# Patient Record
Sex: Male | Born: 1985 | Race: White | Hispanic: No | Marital: Single | Smoking: Never smoker
Health system: Southern US, Community
[De-identification: ages and names within clinical notes are randomized; demographics above are authoritative.]

## PROBLEM LIST (undated history)

## (undated) DIAGNOSIS — K5090 Crohn's disease, unspecified, without complications: Secondary | ICD-10-CM

## (undated) DIAGNOSIS — F32A Depression, unspecified: Secondary | ICD-10-CM

## (undated) DIAGNOSIS — G809 Cerebral palsy, unspecified: Secondary | ICD-10-CM

## (undated) DIAGNOSIS — G8389 Other specified paralytic syndromes: Secondary | ICD-10-CM

## (undated) DIAGNOSIS — F329 Major depressive disorder, single episode, unspecified: Secondary | ICD-10-CM

## (undated) HISTORY — PX: KNEE SURGERY: SHX244

## (undated) HISTORY — PX: RHIZOTOMY: SHX2355

## (undated) HISTORY — PX: HIP OSTEOTOMY: SHX984

---
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
## 2019-05-30 ENCOUNTER — Emergency Department (HOSPITAL_BASED_OUTPATIENT_CLINIC_OR_DEPARTMENT_OTHER): Payer: Medicare Other

## 2019-05-30 ENCOUNTER — Other Ambulatory Visit: Payer: Self-pay

## 2019-05-30 ENCOUNTER — Emergency Department (HOSPITAL_BASED_OUTPATIENT_CLINIC_OR_DEPARTMENT_OTHER)
Admission: EM | Admit: 2019-05-30 | Discharge: 2019-05-30 | Disposition: A | Discharge status: Home / Self Care | Payer: Medicare Other | Attending: Emergency Medicine | Admitting: Emergency Medicine

## 2019-05-30 ENCOUNTER — Encounter (HOSPITAL_BASED_OUTPATIENT_CLINIC_OR_DEPARTMENT_OTHER): Payer: Self-pay

## 2019-05-30 DIAGNOSIS — G510 Bell's palsy: Secondary | ICD-10-CM | POA: Diagnosis not present

## 2019-05-30 DIAGNOSIS — Z79899 Other long term (current) drug therapy: Secondary | ICD-10-CM | POA: Insufficient documentation

## 2019-05-30 DIAGNOSIS — R519 Headache, unspecified: Secondary | ICD-10-CM | POA: Insufficient documentation

## 2019-05-30 DIAGNOSIS — R29810 Facial weakness: Secondary | ICD-10-CM | POA: Diagnosis present

## 2019-05-30 DIAGNOSIS — G8389 Other specified paralytic syndromes: Secondary | ICD-10-CM | POA: Diagnosis not present

## 2019-05-30 HISTORY — DX: Other specified paralytic syndromes: G83.89

## 2019-05-30 HISTORY — DX: Depression, unspecified: F32.A

## 2019-05-30 HISTORY — DX: Crohn's disease, unspecified, without complications: K50.90

## 2019-05-30 LAB — PROTIME-INR
INR: 1 (ref 0.8–1.2)
Prothrombin Time: 13 seconds (ref 11.4–15.2)

## 2019-05-30 LAB — DIFFERENTIAL
Abs Immature Granulocytes: 0.03 10*3/uL (ref 0.00–0.07)
Basophils Absolute: 0.1 10*3/uL (ref 0.0–0.1)
Basophils Relative: 1 %
Eosinophils Absolute: 0.1 10*3/uL (ref 0.0–0.5)
Eosinophils Relative: 2 %
Immature Granulocytes: 0 %
Lymphocytes Relative: 16 %
Lymphs Abs: 1.4 10*3/uL (ref 0.7–4.0)
Monocytes Absolute: 0.6 10*3/uL (ref 0.1–1.0)
Monocytes Relative: 7 %
Neutro Abs: 6.6 10*3/uL (ref 1.7–7.7)
Neutrophils Relative %: 74 %

## 2019-05-30 LAB — CBC
HCT: 45.6 % (ref 39.0–52.0)
Hemoglobin: 14.8 g/dL (ref 13.0–17.0)
MCH: 27.3 pg (ref 26.0–34.0)
MCHC: 32.5 g/dL (ref 30.0–36.0)
MCV: 84 fL (ref 80.0–100.0)
Platelets: 286 10*3/uL (ref 150–400)
RBC: 5.43 MIL/uL (ref 4.22–5.81)
RDW: 13 % (ref 11.5–15.5)
WBC: 8.9 10*3/uL (ref 4.0–10.5)
nRBC: 0 % (ref 0.0–0.2)

## 2019-05-30 LAB — RAPID URINE DRUG SCREEN, HOSP PERFORMED
Amphetamines: NOT DETECTED
Barbiturates: NOT DETECTED
Benzodiazepines: NOT DETECTED
Cocaine: NOT DETECTED
Opiates: NOT DETECTED
Tetrahydrocannabinol: NOT DETECTED

## 2019-05-30 LAB — COMPREHENSIVE METABOLIC PANEL
ALT: 27 U/L (ref 0–44)
AST: 18 U/L (ref 15–41)
Albumin: 4.2 g/dL (ref 3.5–5.0)
Alkaline Phosphatase: 34 U/L — ABNORMAL LOW (ref 38–126)
Anion gap: 9 (ref 5–15)
BUN: 10 mg/dL (ref 6–20)
CO2: 27 mmol/L (ref 22–32)
Calcium: 9 mg/dL (ref 8.9–10.3)
Chloride: 102 mmol/L (ref 98–111)
Creatinine, Ser: 0.68 mg/dL (ref 0.61–1.24)
GFR calc Af Amer: >60 mL/min (ref >60)
GFR calc non Af Amer: >60 mL/min (ref >60)
Glucose, Bld: 126 mg/dL — ABNORMAL HIGH (ref 70–99)
Potassium: 4.1 mmol/L (ref 3.5–5.1)
Sodium: 138 mmol/L (ref 135–145)
Total Bilirubin: 0.4 mg/dL (ref 0.3–1.2)
Total Protein: 8.2 g/dL — ABNORMAL HIGH (ref 6.5–8.1)

## 2019-05-30 LAB — URINALYSIS, ROUTINE W REFLEX MICROSCOPIC
Bilirubin Urine: NEGATIVE
Glucose, UA: NEGATIVE mg/dL
Hgb urine dipstick: NEGATIVE
Ketones, ur: NEGATIVE mg/dL
Leukocytes,Ua: NEGATIVE
Nitrite: NEGATIVE
Protein, ur: NEGATIVE mg/dL
Specific Gravity, Urine: 1.025 (ref 1.005–1.030)
pH: 7 (ref 5.0–8.0)

## 2019-05-30 LAB — ETHANOL: Alcohol, Ethyl (B): <10 mg/dL (ref <10)

## 2019-05-30 LAB — APTT: aPTT: 32 seconds (ref 24–36)

## 2019-05-30 MED ORDER — VALACYCLOVIR HCL 1 G PO TABS: 1000.0000 mg | ORAL_TABLET | Freq: Three times a day (TID) | ORAL | 0 refills | Status: AC

## 2019-05-30 MED ORDER — DIAZEPAM 5 MG/ML IJ SOLN
5.0000 mg | Freq: Once | INTRAMUSCULAR | Status: AC
  Administered 2019-05-30: 5 mg via INTRAVENOUS
  Filled 2019-05-30: qty 2

## 2019-05-30 MED ORDER — PREDNISONE 20 MG PO TABS: 60.0000 mg | ORAL_TABLET | Freq: Every day | ORAL | 0 refills | Status: AC

## 2019-05-30 MED ORDER — ARTIFICIAL TEARS OPHTHALMIC OINT: TOPICAL_OINTMENT | Freq: Every day | OPHTHALMIC | 0 refills | Status: AC

## 2019-05-30 MED ORDER — HYPROMELLOSE (GONIOSCOPIC) 2.5 % OP SOLN: 1.0000 [drp] | Freq: Four times a day (QID) | OPHTHALMIC | 0 refills | Status: AC

## 2019-05-30 NOTE — Discharge Instructions (Addendum)
Take Valtrex and prednisone as prescribed for 1 week.  Use 1 drop in the right eye artificial tears 4 times daily during the day and apply artificial tears ophthalmic ointment at night.  This is to help keep your eye moist due to the Bell's palsy causing you to have difficulty closing your eyes completely.  Please follow-up with your doctor next week for recheck.  Please return to emergency department if you develop any new or worsening symptoms.

## 2019-05-30 NOTE — ED Triage Notes (Signed)
Pt presents to ED with mother, states that when he woke up this morning around 12 pm his face was "drawn up to the left". Mother at bedside also reports that he was c/o headache last night. Pt also reports some chest discomfort starting yesterday.

## 2019-05-30 NOTE — Consult Note (Signed)
[facility] Consult Services  Stat Consult  Date of Service:   05/30/2019 18:03:03  Impression:     .  Bell's palsy  Comments/Sign-Out: Recommended MRI brain because he had COVID 19 and want to make sure no lesions causing the right facial palsy. If MRI brain is normal then he can be discharged with prednisone and acyclovir. Recommend follow up with PCP in 1 week after discharged. Oriented mother to make sure that his eye is protected at all times. Needs lubricating drops while awake and also sunglasses or PPE when going out. While sleeping should have a gauze with tape to make sure the eye stay closed while sleeping.  CT HEAD: Showed No Acute Hemorrhage or Acute Core Infarct  Metrics: [facility] Notification Time: 05/30/2019 18:00:42 Stamp Time: 05/30/2019 18:03:03 Callback Response Time: 05/30/2019 18:03:39 Video Start Time: 05/30/2019 18:14:00 Video End Time: 05/30/2019 18:17:05  Our recommendations are outlined below.  Imaging Studies:     .  MRI Head  Disposition: Neurology Follow Up Recommended  Sign Out:     .  Discussed with Emergency Department Provider  ----------------------------------------------------------------------------------------------------  Chief Complaint: right facial paresis  History of Present Illness: Patient is a 33 year old Male.  Patient is 33 y/o male that reported woke up with right facial paresis today. Patient also reported pain on right side of face and jaw and left upper arm. Denied weakness and numbness. He does have history of cerebral palsy but he is able to give some history and his mother is at bedside helping him with history. He had Covid 19 between July and August this year.    Past Medical History:     . There is NO history of Hypertension     . There is NO history of Diabetes Mellitus     . There is NO history of Hyperlipidemia     . There is NO history of Atrial Fibrillation     .  There is NO history of Coronary Artery Disease     . There is NO history of Stroke  Anticoagulant use:  No  Antiplatelet use: No Examination: BP(152/100), Pulse(107), Blood Glucose(126) 1A: Level of Consciousness - Alert; keenly responsive + 0 1B: Ask Month and Age - Both Questions Right + 0 1C: Blink Eyes & Squeeze Hands - Performs Both Tasks + 0 2: Test Horizontal Extraocular Movements - Normal + 0 3: Test Visual Fields - No Visual Loss + 0 4: Test Facial Palsy (Use Grimace if Obtunded) - Unilateral Complete paralysis (upper/lower face) + 3 5A: Test Left Arm Motor Drift - No Drift for 10 Seconds + 0 5B: Test Right Arm Motor Drift - No Drift for 10 Seconds + 0 6A: Test Left Leg Motor Drift - No Drift for 5 Seconds + 0 6B: Test Right Leg Motor Drift - No Drift for 5 Seconds + 0 7: Test Limb Ataxia (FNF/Heel-Shin) - No Ataxia + 0 8: Test Sensation - Normal; No sensory loss + 0 9: Test Language/Aphasia - Normal; No aphasia + 0 10: Test Dysarthria - Normal + 0 11: Test Extinction/Inattention - No abnormality + 0  NIHSS Score: 3     Dr Anabel Halon   [facility] [phone]  Case [id number]

## 2019-05-30 NOTE — ED Notes (Signed)
Tele Neuro set up in room.

## 2019-05-30 NOTE — ED Provider Notes (Signed)
[facility] EMERGENCY DEPARTMENT Provider Note   CSN: [id number] Arrival date & time: 05/30/19  1556     History   Chief Complaint Chief Complaint  Patient presents with  . Stroke Symptoms    HPI Arthur Abbott is a 33 y.o. male with history of cerebral palsy with triplegia, Crohn's disease who presents with right-sided facial droop and headache.  Last known normal last evening around 8 PM.  Patient denies any new numbness or weakness, however triplegic at baseline.  No new speech changes noted by family.  Patient denies any vision changes, although does not wear his glasses when he is supposed to, and notes his vision has been worsening.  He denies any chest pain, shortness of breath, abdominal pain, nausea, vomiting.     HPI  Past Medical History:  Diagnosis Date  . Crohn disease ([place])   . Depression   . 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   History reviewed. No pertinent surgical history.      Home Medications    Prior to Admission medications   Medication Sig Start Date End Date Taking? Authorizing Provider  busPIRone (BUSPAR) 15 MG tablet Take 1 tab bid 01/17/17  Yes [provider]  DULoxetine (CYMBALTA) 60 MG capsule Take 1 cap twice a day 01/17/17  Yes [provider]  mercaptopurine (PURINETHOL) 50 MG tablet TAKE ONE BY MOUTH DAILY 03/28/14  Yes [provider]  risperiDONE (RISPERDAL) 0.25 MG tablet Take 1 to 2 tabs in the morning and 1 to 2 tab at night 01/17/17  Yes [provider]  artificial tears (LACRILUBE) OINT ophthalmic ointment Place into both eyes at bedtime. 05/30/19   Lashawna Poche, Bea Graff, PA-C  hydroxypropyl methylcellulose / hypromellose (ISOPTO TEARS / GONIOVISC) 2.5 % ophthalmic solution Place 1 drop into the right eye 4 (four) times daily. 05/30/19   Jovann Luse, Bea Graff, PA-C  predniSONE (DELTASONE) 20 MG tablet Take 3 tablets (60 mg total) by mouth daily for 7 days.  05/30/19 06/06/19  Frederica Kuster, PA-C  valACYclovir (VALTREX) 1000 MG tablet Take 1 tablet (1,000 mg total) by mouth 3 (three) times daily for 7 days. 05/30/19 06/06/19  Frederica Kuster, PA-C    Family History History reviewed. No pertinent family history.  Social History Social History   Tobacco Use  . Smoking status: Never Smoker  . Smokeless tobacco: Never Used  Substance Use Topics  . Alcohol use: Never    Frequency: Never  . Drug use: Never     Allergies   Patient has no known allergies.   Review of Systems Review of Systems  Constitutional: Negative for chills and fever.  HENT: Positive for facial swelling. Negative for sore throat.   Respiratory: Negative for shortness of breath.   Cardiovascular: Negative for chest pain.  Gastrointestinal: Negative for abdominal pain, nausea and vomiting.  Genitourinary: Negative for dysuria.  Musculoskeletal: Negative for back pain.  Skin: Negative for rash and wound.  Neurological: Positive for headaches.  Psychiatric/Behavioral: The patient is not nervous/anxious.      Physical Exam Updated Vital Signs BP (!) 143/93 (BP Location: Right Arm)   Pulse 94   Temp 98.6 F (37 C) (Oral)   Resp 20   Ht 5\' 8"  (1.727 m)   Wt 97.5 kg   SpO2 100%   BMI 32.69 kg/m   Physical Exam Vitals signs and nursing note reviewed.  Constitutional:  General: He is not in acute distress.    Appearance: He is well-developed. He is not diaphoretic.  HENT:     Head: Normocephalic and atraumatic.     Mouth/Throat:     Pharynx: No oropharyngeal exudate.  Eyes:     General: No scleral icterus.       Right eye: No discharge.        Left eye: No discharge.     Conjunctiva/sclera: Conjunctivae normal.     Pupils: Pupils are equal, round, and reactive to light.     Comments: Difficulty closing right eye  Neck:     Musculoskeletal: Normal range of motion and neck supple.     Thyroid: No thyromegaly.  Cardiovascular:     Rate and  Rhythm: Normal rate and regular rhythm.     Heart sounds: Normal heart sounds. No murmur. No friction rub. No gallop.   Pulmonary:     Effort: Pulmonary effort is normal. No respiratory distress.     Breath sounds: Normal breath sounds. No stridor. No wheezing or rales.  Abdominal:     General: Bowel sounds are normal. There is no distension.     Palpations: Abdomen is soft.     Tenderness: There is no abdominal tenderness. There is no guarding or rebound.  Lymphadenopathy:     Cervical: No cervical adenopathy.  Skin:    General: Skin is warm and dry.     Coloration: Skin is not pale.     Findings: No rash.  Neurological:     Mental Status: He is alert.     Comments: CN 3-12 intact; normal sensation throughout; 5/5 strength in bilateral upper extremities, 3/5 to lower extremities bilaterally; equal bilateral grip strength; right-sided facial droop; weakness in R eyelids       ED Treatments / Results  Labs (all labs ordered are listed, but only abnormal results are displayed) Labs Reviewed  COMPREHENSIVE METABOLIC PANEL - Abnormal; Notable for the following components:      Result Value   Glucose, Bld 126 (*)    Total Protein 8.2 (*)    Alkaline Phosphatase 34 (*)    All other components within normal limits  ETHANOL  PROTIME-INR  APTT  CBC  DIFFERENTIAL  RAPID URINE DRUG SCREEN, HOSP PERFORMED  URINALYSIS, ROUTINE W REFLEX MICROSCOPIC    EKG EKG Interpretation  Date/Time:  Saturday May 30 2019 16:47:31 EST Ventricular Rate:  113 PR Interval:    QRS Duration: 89 QT Interval:  320 QTC Calculation: 439 R Axis:   63 Text Interpretation: Sinus tachycardia Borderline T wave abnormalities No old tracing to compare Confirmed by Rolan Bucco [phone]) on 05/30/2019 8:15:40 PM   Radiology Ct Head Wo Contrast  Result Date: 05/30/2019 CLINICAL DATA:  33 year old male with history of headache. Right-sided facial droop. EXAM: CT HEAD WITHOUT CONTRAST TECHNIQUE:  Contiguous axial images were obtained from the base of the skull through the vertex without intravenous contrast. COMPARISON:  No priors. FINDINGS: Brain: No evidence of acute infarction, hemorrhage, hydrocephalus, extra-axial collection or mass lesion/mass effect. Vascular: No hyperdense vessel or unexpected calcification. Skull: Normal. Negative for fracture or focal lesion. Sinuses/Orbits: No acute finding. Other: None. IMPRESSION: 1. No acute intracranial abnormalities. The appearance of the brain is normal. Electronically Signed   By: Trudie Reed M.D.   On: 05/30/2019 17:28   Mr Brain Wo Contrast  Result Date: 05/30/2019 CLINICAL DATA:  Left-sided facial droop starting today. EXAM: MRI HEAD WITHOUT CONTRAST TECHNIQUE: Multiplanar, multiecho pulse  sequences of the brain and surrounding structures were obtained without intravenous contrast. COMPARISON:  None. FINDINGS: Brain: No acute infarction, hemorrhage, hydrocephalus, extra-axial collection or mass lesion. Paucity of periventricular white matter with corpus callosum thinning, periventricular T2 hyperintensity and small right sided corona radiata cyst. Findings consistent with PVL in this patient with history of cerebral palsy. Vascular: Normal flow voids. Skull and upper cervical spine: Normal marrow signal. Sinuses/Orbits: Negative. No abnormality noted at the temporal bone or in the visualized parotid region. IMPRESSION: 1. No acute finding. 2. Periventricular leukomalacia. Electronically Signed   By: Marnee SpringJonathon  Watts M.D.   On: 05/30/2019 20:25    Procedures Procedures (including critical care time)  Medications Ordered in ED Medications  diazepam (VALIUM) injection 5 mg (5 mg Intravenous Given 05/30/19 1915)     Initial Impression / Assessment and Plan / ED Course  I have reviewed the triage vital signs and the nursing notes.  Pertinent labs & imaging results that were available during my care of the patient were reviewed by me  and considered in my medical decision making (see chart for details).        Patient presenting with right-sided facial droop and possibly right-sided weakness.  Neuro exam is very difficult and clouded due to patient's triplegia.  Labs are unremarkable.  CT head is negative.  After CT head, patient was evaluated by teleneurologist, Dr. Louann LivFeliciano, who advised MRI of the brain.  This was negative for acute findings.  Suspect Bell's palsy as cause of patient's symptoms.  Will treat with prednisone and Valtrex.  We will also discharge home with artificial tears and ointment.  Follow-up to PCP for recheck.  Return precautions discussed.  Patient and mother understand and agree with plan.  Patient vitals stable throughout ED course and discharged in satisfactory condition.  Patient also evaluated by my attending, Dr. Fredderick PhenixBelfi, who guided the patient's management and agrees with plan.  Final Clinical Impressions(s) / ED Diagnoses   Final diagnoses:  Bell's palsy    ED Discharge Orders         Ordered    predniSONE (DELTASONE) 20 MG tablet  Daily     05/30/19 2122    valACYclovir (VALTREX) 1000 MG tablet  3 times daily     05/30/19 2122    artificial tears (LACRILUBE) OINT ophthalmic ointment  Daily at bedtime     05/30/19 2122    hydroxypropyl methylcellulose / hypromellose (ISOPTO TEARS / GONIOVISC) 2.5 % ophthalmic solution  4 times daily     05/30/19 2122           Emi HolesLaw, Janaiyah Blackard M, PA-C 05/30/19 2252    Rolan BuccoBelfi, Melanie, MD 05/30/19 2333

## 2019-05-30 NOTE — ED Notes (Signed)
Mother took patient to the car to allow husband to put him in the car.  Reports she will come back for paperwork.

## 2019-05-30 NOTE — ED Notes (Signed)
Taken to MRI 

## 2019-05-30 NOTE — ED Notes (Addendum)
Decided to come back to room to finish visit.

## 2021-02-01 IMAGING — MR MR HEAD W/O CM
10 series · 48 of 48 positions shown · non-contrast
Comparison: None.

CLINICAL DATA: Left-sided facial droop starting today.

EXAM:
MRI HEAD WITHOUT CONTRAST
TECHNIQUE: Multiplanar, multiecho pulse sequences of the brain and surrounding
structures were obtained without intravenous contrast.

[Series 2: T1 · sagittal · 5.0mm · 0.94mm/px · 3 of 27 slices shown]
[im 1/27]
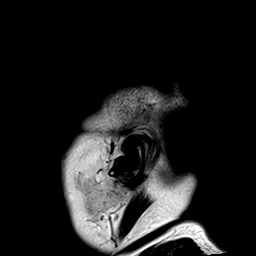
[im 14/27]
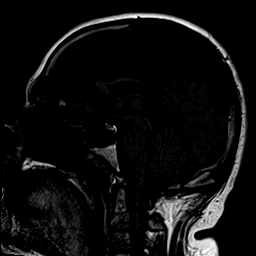
[im 27/27]
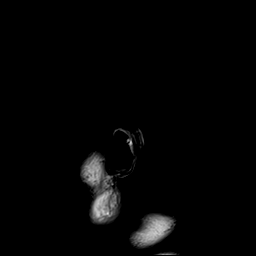

[Series 3: DWI · axial · 3.0mm · 1.95mm/px · z∈[-40,+100]mm · 9 of 96 slices shown (1 of 4)]
[im 1/96]
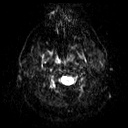
[im 12/96]
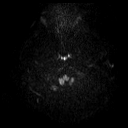
[im 24/96]
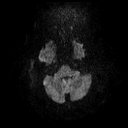
[im 36/96]
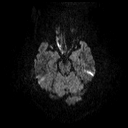
[im 48/96]
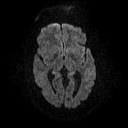
[im 60/96]
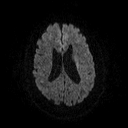
[im 72/96]
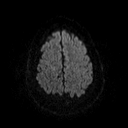
[im 84/96]
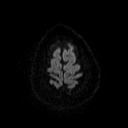
[im 96/96]
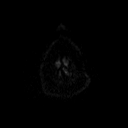

[Series 4: DWI · axial · 3.0mm · 1.95mm/px · z∈[-40,+100]mm · 4 of 48 slices shown (2 of 4)]
[im 1/48]
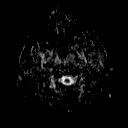
[im 16/48]
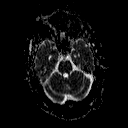
[im 32/48]
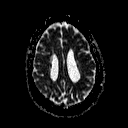
[im 48/48]
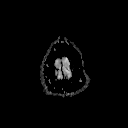

[Series 5: DWI · coronal · 3.0mm · 1.95mm/px · 8 of 90 slices shown (3 of 4)]
[im 1/90]
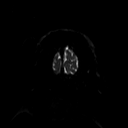
[im 13/90]
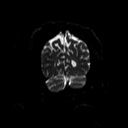
[im 26/90]
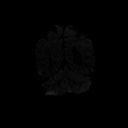
[im 39/90]
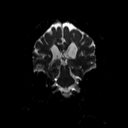
[im 51/90]
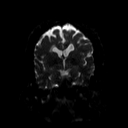
[im 64/90]
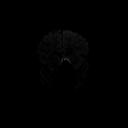
[im 77/90]
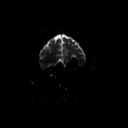
[im 90/90]
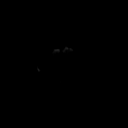

[Series 6: DWI · coronal · 3.0mm · 1.95mm/px · 4 of 45 slices shown (4 of 4)]
[im 1/45]
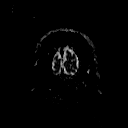
[im 15/45]
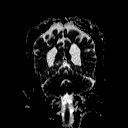
[im 30/45]
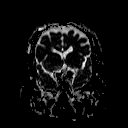
[im 45/45]
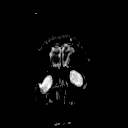

[Series 7: T2 · axial · 5.0mm · 0.94mm/px · z∈[-45,+102]mm · 2 of 28 slices shown (1 of 3)]
[im 1/28]
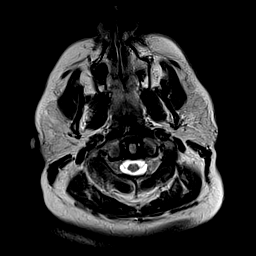
[im 28/28]
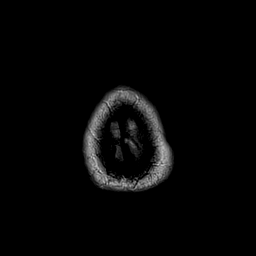

[Series 8: T2 · axial · 5.0mm · 0.47mm/px · z∈[-45,+102]mm · 2 of 28 slices shown (2 of 3)]
[im 1/28]
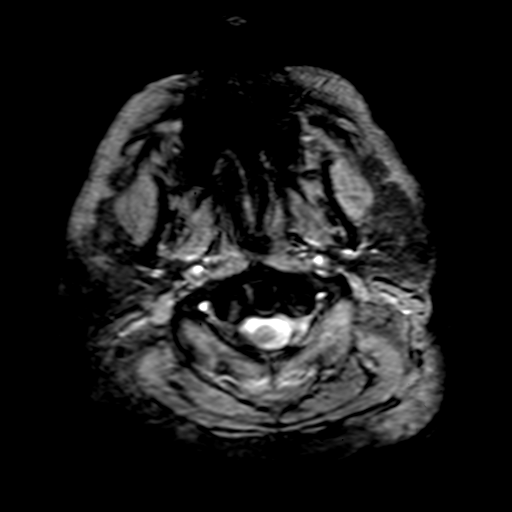
[im 28/28]
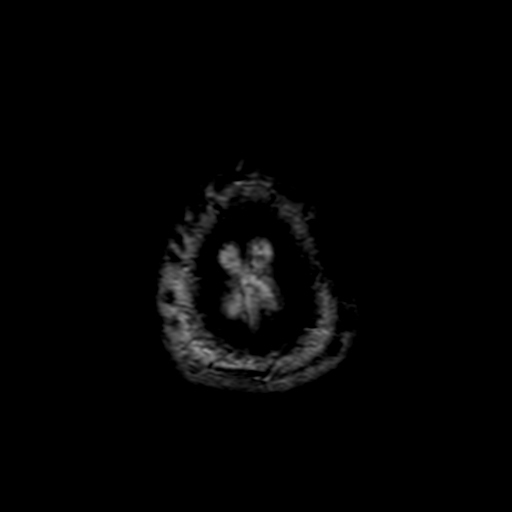

[Series 9: FLAIR · axial · 3.0mm · 0.43mm/px · z∈[-49,+99]mm · 4 of 42 slices shown]
[im 1/42]
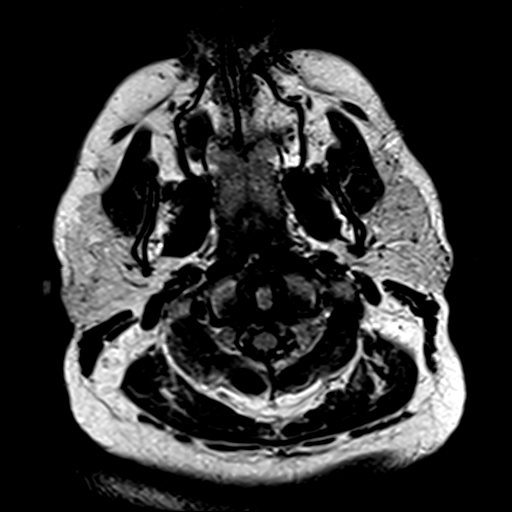
[im 14/42]
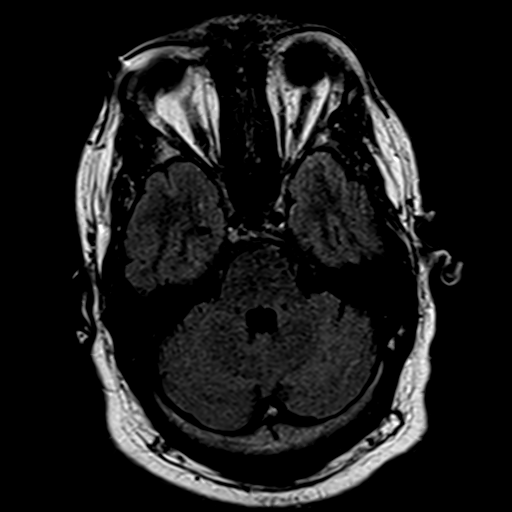
[im 28/42]
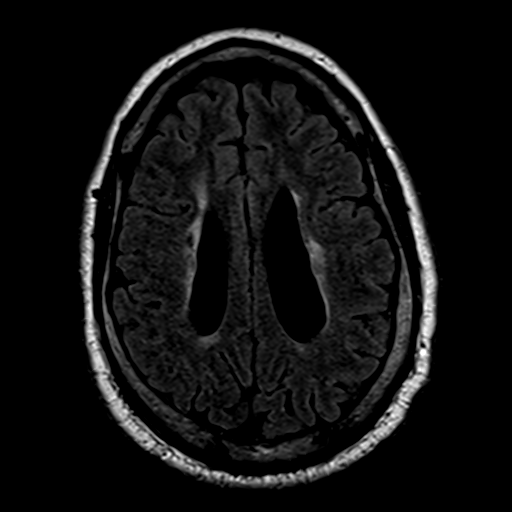
[im 42/42]
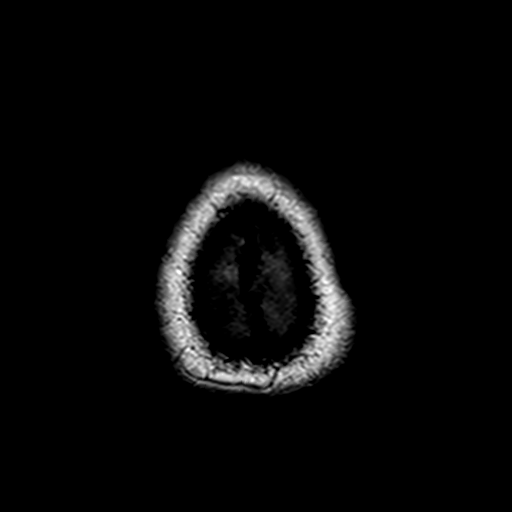

[Series 10: t1_3d_tra · axial · 2.0mm · 1.00mm/px · z∈[-63,+124]mm · 9 of 104 slices shown]
[im 1/104]
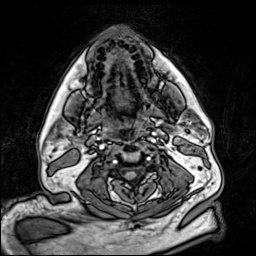
[im 13/104]
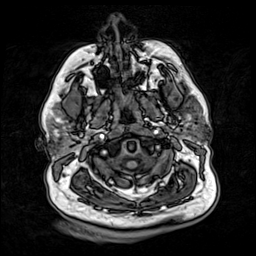
[im 26/104]
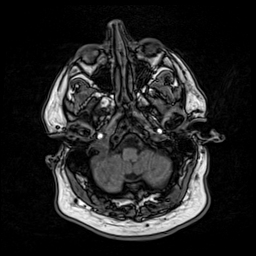
[im 39/104]
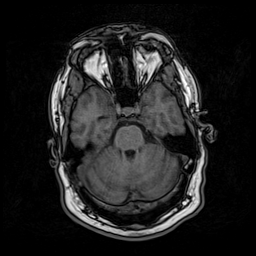
[im 52/104]
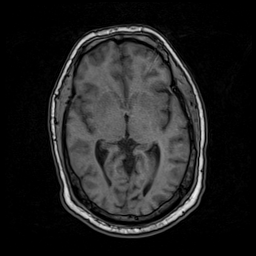
[im 65/104]
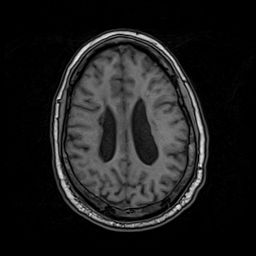
[im 78/104]
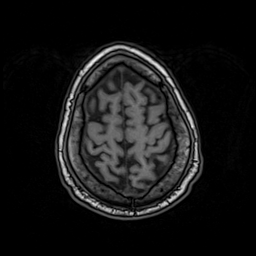
[im 91/104]
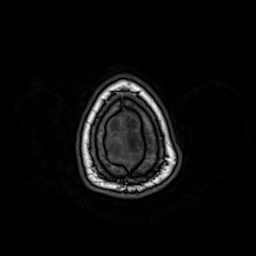
[im 104/104]
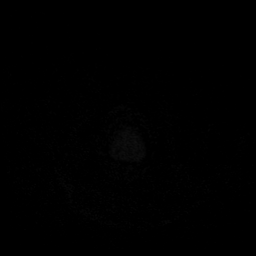

[Series 11: T2 · coronal · 5.0mm · 0.94mm/px · 3 of 30 slices shown (3 of 3)]
[im 1/30]
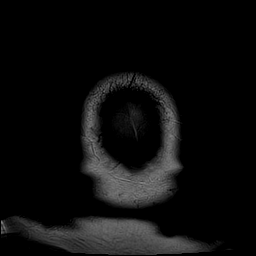
[im 15/30]
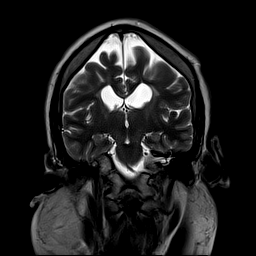
[im 30/30]
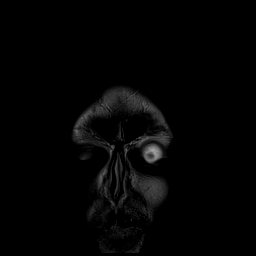

[48 of 48 positions shown; findings below may reference images not displayed]

FINDINGS: Brain: No acute infarction, hemorrhage, hydrocephalus, extra-axial
collection or mass lesion. Paucity of periventricular white matter
with corpus callosum thinning, periventricular T2 hyperintensity and
small right sided corona radiata cyst. Findings consistent with PVL
in this patient with history of cerebral palsy.

Vascular: Normal flow voids.

Skull and upper cervical spine: Normal marrow signal.

Sinuses/Orbits: Negative. No abnormality noted at the temporal bone
or in the visualized parotid region.
IMPRESSION: 1. No acute finding.
2. Periventricular leukomalacia.

## 2021-12-20 ENCOUNTER — Emergency Department (HOSPITAL_BASED_OUTPATIENT_CLINIC_OR_DEPARTMENT_OTHER)
Admission: EM | Admit: 2021-12-20 | Discharge: 2021-12-20 | Disposition: A | Discharge status: Home / Self Care | Payer: Medicare Other | Attending: Emergency Medicine | Admitting: Emergency Medicine

## 2021-12-20 ENCOUNTER — Encounter (HOSPITAL_BASED_OUTPATIENT_CLINIC_OR_DEPARTMENT_OTHER): Payer: Self-pay | Admitting: Emergency Medicine

## 2021-12-20 ENCOUNTER — Other Ambulatory Visit: Payer: Self-pay

## 2021-12-20 DIAGNOSIS — M62838 Other muscle spasm: Secondary | ICD-10-CM | POA: Diagnosis present

## 2021-12-20 DIAGNOSIS — G8929 Other chronic pain: Secondary | ICD-10-CM | POA: Diagnosis not present

## 2021-12-20 DIAGNOSIS — R109 Unspecified abdominal pain: Secondary | ICD-10-CM | POA: Diagnosis not present

## 2021-12-20 DIAGNOSIS — M79605 Pain in left leg: Secondary | ICD-10-CM | POA: Diagnosis not present

## 2021-12-20 DIAGNOSIS — M79604 Pain in right leg: Secondary | ICD-10-CM | POA: Diagnosis not present

## 2021-12-20 DIAGNOSIS — Z87440 Personal history of urinary (tract) infections: Secondary | ICD-10-CM | POA: Diagnosis not present

## 2021-12-20 DIAGNOSIS — M549 Dorsalgia, unspecified: Secondary | ICD-10-CM | POA: Diagnosis not present

## 2021-12-20 DIAGNOSIS — R519 Headache, unspecified: Secondary | ICD-10-CM | POA: Diagnosis not present

## 2021-12-20 DIAGNOSIS — Z993 Dependence on wheelchair: Secondary | ICD-10-CM | POA: Diagnosis not present

## 2021-12-20 DIAGNOSIS — R079 Chest pain, unspecified: Secondary | ICD-10-CM | POA: Diagnosis not present

## 2021-12-20 DIAGNOSIS — R29810 Facial weakness: Secondary | ICD-10-CM | POA: Insufficient documentation

## 2021-12-20 DIAGNOSIS — M7918 Myalgia, other site: Secondary | ICD-10-CM

## 2021-12-20 HISTORY — DX: Cerebral palsy, unspecified: G80.9

## 2021-12-20 LAB — CBC WITH DIFFERENTIAL/PLATELET
Abs Immature Granulocytes: 0.03 10*3/uL (ref 0.00–0.07)
Basophils Absolute: 0 10*3/uL (ref 0.0–0.1)
Basophils Relative: 1 %
Eosinophils Absolute: 0.1 10*3/uL (ref 0.0–0.5)
Eosinophils Relative: 1 %
HCT: 42.4 % (ref 39.0–52.0)
Hemoglobin: 14.6 g/dL (ref 13.0–17.0)
Immature Granulocytes: 0 %
Lymphocytes Relative: 21 %
Lymphs Abs: 1.9 10*3/uL (ref 0.7–4.0)
MCH: 29 pg (ref 26.0–34.0)
MCHC: 34.4 g/dL (ref 30.0–36.0)
MCV: 84.1 fL (ref 80.0–100.0)
Monocytes Absolute: 0.6 10*3/uL (ref 0.1–1.0)
Monocytes Relative: 6 %
Neutro Abs: 6.3 10*3/uL (ref 1.7–7.7)
Neutrophils Relative %: 71 %
Platelets: 316 10*3/uL (ref 150–400)
RBC: 5.04 MIL/uL (ref 4.22–5.81)
RDW: 13.5 % (ref 11.5–15.5)
WBC: 8.9 10*3/uL (ref 4.0–10.5)
nRBC: 0 % (ref 0.0–0.2)

## 2021-12-20 LAB — URINALYSIS, ROUTINE W REFLEX MICROSCOPIC
Bilirubin Urine: NEGATIVE
Glucose, UA: NEGATIVE mg/dL
Hgb urine dipstick: NEGATIVE
Ketones, ur: NEGATIVE mg/dL
Leukocytes,Ua: NEGATIVE
Nitrite: NEGATIVE
Protein, ur: NEGATIVE mg/dL
Specific Gravity, Urine: 1.025 (ref 1.005–1.030)
pH: 7 (ref 5.0–8.0)

## 2021-12-20 LAB — COMPREHENSIVE METABOLIC PANEL
ALT: 24 U/L (ref 0–44)
AST: 19 U/L (ref 15–41)
Albumin: 4.1 g/dL (ref 3.5–5.0)
Alkaline Phosphatase: 34 U/L — ABNORMAL LOW (ref 38–126)
Anion gap: 9 (ref 5–15)
BUN: 13 mg/dL (ref 6–20)
CO2: 25 mmol/L (ref 22–32)
Calcium: 9 mg/dL (ref 8.9–10.3)
Chloride: 103 mmol/L (ref 98–111)
Creatinine, Ser: 0.72 mg/dL (ref 0.61–1.24)
GFR, Estimated: >60 mL/min (ref >60)
Glucose, Bld: 104 mg/dL — ABNORMAL HIGH (ref 70–99)
Potassium: 3.5 mmol/L (ref 3.5–5.1)
Sodium: 137 mmol/L (ref 135–145)
Total Bilirubin: 0.3 mg/dL (ref 0.3–1.2)
Total Protein: 8.2 g/dL — ABNORMAL HIGH (ref 6.5–8.1)

## 2021-12-20 LAB — MAGNESIUM: Magnesium: 2 mg/dL (ref 1.7–2.4)

## 2021-12-20 LAB — TROPONIN I (HIGH SENSITIVITY): Troponin I (High Sensitivity): 2 ng/L (ref <18)

## 2021-12-20 MED ORDER — LIDOCAINE 5 % EX PTCH: 1.0000 | MEDICATED_PATCH | CUTANEOUS | 0 refills | Status: AC

## 2021-12-20 MED ORDER — CYCLOBENZAPRINE HCL 10 MG PO TABS: 10.0000 mg | ORAL_TABLET | Freq: Two times a day (BID) | ORAL | 0 refills | Status: AC | PRN

## 2021-12-20 NOTE — Discharge Instructions (Signed)
Your history, exam, work-up today are consistent with muscle cramps and spasms likely related to the position you have been sitting in with your different chair.  Your lab testing today was overall reassuring.  We had a shared decision-making conversation and agreed to hold on more extensive work-up at this time however we do want to offer you prescriptions for muscle relaxant and Lidoderm patches to help with the pain.  Due to the chronic left sided facial droop and the eye difficulties, we do recommend following with an ophthalmologist.  Please do this.  Please keep your eye moistened with artificial teardrops over-the-counter and rest and stay hydrated.  If any symptoms change or worsen acutely, please return to the nearest Emergency Department.

## 2021-12-20 NOTE — ED Triage Notes (Signed)
Pt reports all over body pain; mother sts pt in is a different wheelchair because his is broke; she thinks the chair is too small and causing him pain

## 2021-12-20 NOTE — ED Provider Notes (Signed)
[facility] EMERGENCY DEPARTMENT Provider Note   CSN: [id number] Arrival date & time: 12/20/21  1811     History  Chief Complaint  Patient presents with   Generalized Body Aches    Arthur Abbott is a 36 y.o. male.  The history is provided by the patient and medical records. No language interpreter was used.  Illness Location:  Diffuse aches and muscle pains/soreness Severity:  Moderate Onset quality:  Gradual Duration:  3 months Timing:  Constant Progression:  Waxing and waning Chronicity:  Chronic Associated symptoms: chest pain   Associated symptoms: no abdominal pain, no congestion, no cough, no diarrhea, no fatigue, no fever, no headaches, no loss of consciousness, no nausea, no rash, no rhinorrhea, no shortness of breath and no vomiting        Home Medications Prior to Admission medications   Medication Sig Start Date End Date Taking? Authorizing Provider  artificial tears (LACRILUBE) OINT ophthalmic ointment Place into both eyes at bedtime. 05/30/19   Law, Waylan Boga, PA-C  busPIRone (BUSPAR) 15 MG tablet Take 1 tab bid 01/17/17   [provider]  DULoxetine (CYMBALTA) 60 MG capsule Take 1 cap twice a day 01/17/17   [provider]  hydroxypropyl methylcellulose / hypromellose (ISOPTO TEARS / GONIOVISC) 2.5 % ophthalmic solution Place 1 drop into the right eye 4 (four) times daily. 05/30/19   Law, Waylan Boga, PA-C  mercaptopurine (PURINETHOL) 50 MG tablet TAKE ONE BY MOUTH DAILY 03/28/14   [provider]  risperiDONE (RISPERDAL) 0.25 MG tablet Take 1 to 2 tabs in the morning and 1 to 2 tab at night 01/17/17   [provider]      Allergies    Patient has no known allergies.    Review of Systems   Review of Systems  Constitutional:  Negative for chills, diaphoresis, fatigue and fever.  HENT:  Negative for congestion and rhinorrhea.   Eyes:  Negative for visual disturbance.  Respiratory:  Negative for cough, chest  tightness and shortness of breath.   Cardiovascular:  Positive for chest pain. Negative for palpitations and leg swelling.  Gastrointestinal:  Negative for abdominal pain, constipation, diarrhea, nausea and vomiting.  Genitourinary:  Negative for dysuria, flank pain and frequency.  Musculoskeletal:  Positive for back pain. Negative for neck pain and neck stiffness.  Skin:  Negative for rash and wound.  Neurological:  Positive for facial asymmetry (at baseline). Negative for dizziness, seizures, loss of consciousness, weakness (at baseline), light-headedness, numbness and headaches.  Psychiatric/Behavioral:  Negative for agitation and confusion.     Physical Exam Updated Vital Signs BP (!) 182/113 (BP Location: Left Arm)   Pulse (!) 109   Temp 97.7 F (36.5 C) (Oral)   Resp 20   Ht 5\' 3"  (1.6 m)   Wt 95.3 kg   SpO2 96%   BMI 37.20 kg/m  Physical Exam Vitals and nursing note reviewed.  Constitutional:      General: He is not in acute distress.    Appearance: He is well-developed. He is not ill-appearing, toxic-appearing or diaphoretic.  HENT:     Head: Normocephalic and atraumatic.     Nose: No congestion or rhinorrhea.     Mouth/Throat:     Mouth: Mucous membranes are moist.     Pharynx: No oropharyngeal exudate or posterior oropharyngeal erythema.  Eyes:     Extraocular Movements: Extraocular movements intact.     Conjunctiva/sclera: Conjunctivae normal.     Pupils: Pupils are equal,  round, and reactive to light.  Cardiovascular:     Rate and Rhythm: Normal rate and regular rhythm.     Heart sounds: No murmur heard. Pulmonary:     Effort: Pulmonary effort is normal. No respiratory distress.     Breath sounds: Normal breath sounds. No wheezing, rhonchi or rales.  Chest:     Chest wall: No tenderness.  Abdominal:     General: Abdomen is flat.     Palpations: Abdomen is soft.     Tenderness: There is no abdominal tenderness. There is no right CVA tenderness, left CVA  tenderness, guarding or rebound.  Musculoskeletal:        General: Tenderness present. No swelling or signs of injury.     Cervical back: Neck supple. No tenderness.     Right lower leg: No edema.     Left lower leg: No edema.  Skin:    General: Skin is warm and dry.     Capillary Refill: Capillary refill takes less than 2 seconds.     Findings: No erythema or rash.  Neurological:     Mental Status: He is alert. Mental status is at baseline.     Sensory: No sensory deficit.     Motor: Weakness (at baseline) present.  Psychiatric:        Mood and Affect: Mood normal.     ED Results / Procedures / Treatments   Labs (all labs ordered are listed, but only abnormal results are displayed) Labs Reviewed  COMPREHENSIVE METABOLIC PANEL - Abnormal; Notable for the following components:      Result Value   Glucose, Bld 104 (*)    Total Protein 8.2 (*)    Alkaline Phosphatase 34 (*)    All other components within normal limits  URINE CULTURE  CBC WITH DIFFERENTIAL/PLATELET  URINALYSIS, ROUTINE W REFLEX MICROSCOPIC  MAGNESIUM  TROPONIN I (HIGH SENSITIVITY)    EKG EKG Interpretation  Date/Time:  Wednesday December 20 2021 19:22:06 EDT Ventricular Rate:  93 PR Interval:  130 QRS Duration: 87 QT Interval:  340 QTC Calculation: 423 R Axis:   44 Text Interpretation: Sinus rhythm when compared to prior, similar appearance. No STEMI Confirmed by Theda Belfast ([id number]) on 12/20/2021 7:30:38 PM  Radiology No results found.  Procedures Procedures    Medications Ordered in ED Medications - No data to display  ED Course/ Medical Decision Making/ A&P                           Medical Decision Making Amount and/or Complexity of Data Reviewed Labs: ordered.  Risk Prescription drug management.    Arthur Abbott is a 36 y.o. male with a past medical history significant for cerebral palsy with triplegia, Crohn's disease, depression, and previous UTIs who presents with diffuse aches  and soreness as well as chest pain.  Patient reports that 6 months ago his wheelchair broke that he was accustomed to and he had to have a different one.  He reports that for several months he is having soreness all over his body specifically in his legs and headaches.  He reports having neck pain from being hunched over in the chair.  He denies any new trauma.  Denies fevers or chills.  Denies congestion, cough, nausea, vomiting, constipation, diarrhea, or urinary changes.  Does report that history of UTIs but denies those symptoms now.  He does report he had pain in his back and flanks but does  not report significant pain in his abdomen.  He reports has been having chest pain intermittently for weeks and thought he was having a heart attack today.  He denies any diaphoresis or syncope.  He reports chronic pains in his legs all over but no focal areas.  On exam, lungs clear.  Chest was nontender.  Abdomen was nontender.  Patient has tenderness in his back with spasm palpated.  Tenderness in both legs with spasm palpated on his right thigh.  He has no change from his baseline strength with evidence of weakness in all extremities with some contractures.  He does have a left-sided facial droop which she has had for several years.   When further asked what was the reason he came in today given all of his complaints have been chronic in nature being more than several weeks or even months, he reports that he was having more pain all over and did not want to sit up or do anything.  He still thinks it could be his chair bothering him the most.  He does say that the chest pain today he was concerned about.  We offered some screening labs, EKG, chest x-ray, and urinalysis given his history of UTIs however family does not want a chest x-ray.  They think his pain is more all over and not just his chest.  We agreed and we will get labs, EKG, and urinalysis.  If work-up is reassuring, anticipate discharge home with likely  prescription for muscle relaxant and Lidoderm patches as I suspect his symptoms are more related to some muscle soreness and pain related to the new positioning of his wheelchair that is bothering him.  For the left facial droop for years with left eye dryness, we will give him a number to call to potentially see a new ophthalmologist to discuss further management.  If work-up is reassuring, anticipate discharge home.  Family agrees.  Work-up overall reassuring.  We again discussed doing further work-up and family agrees to hold on that.  They would like medicine to help with his muscle spasms and pains we will give prescription for muscle relaxant and Lidoderm patches.  I spoke with pharmacy who agreed that this appears safe for this patient.  Family will call to follow-up with PCP and will also put amatory referral for social work at discharge.  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  He was also encouraged to use artificial tears to keep it moist   they understand plan of care and return precautions and follow-up instructions and patient discharged in good condition.         Final Clinical Impression(s) / ED Diagnoses Final diagnoses:  Muscle spasm  Musculoskeletal pain    Rx / DC Orders ED Discharge Orders          Ordered    cyclobenzaprine (FLEXERIL) 10 MG tablet  2 times daily PRN        12/20/21 2139    lidocaine (LIDODERM) 5 %  Every 24 hours        12/20/21 2139    Ambulatory referral to Social Work       Comments: Patient reports having the wrong type of wheelchair leading to the muscle pain and spasms.  Did not know if is any other systems that would be available for patient as an outpatient.  Keep   12/20/21 2157  Clinical Impression: 1. Muscle spasm   2. Musculoskeletal pain     Disposition: Discharge  Condition: Good  I have discussed the results, Dx and Tx plan  with the pt(& family if present). He/she/they expressed understanding and agree(s) with the plan. Discharge instructions discussed at great length. Strict return precautions discussed and pt &/or family have verbalized understanding of the instructions. No further questions at time of discharge.    New Prescriptions   CYCLOBENZAPRINE (FLEXERIL) 10 MG TABLET    Take 1 tablet (10 mg total) by mouth 2 (two) times daily as needed for muscle spasms.   LIDOCAINE (LIDODERM) 5 %    Place 1 patch onto the skin daily. Remove & Discard patch within 12 hours or as directed by MD    Follow Up: Dairl Ponder, MD [address] [place] [address] [phone]   with ophthalmilogy for L eye follow up  Your PCP     [facility] [id number] [address] [place] [address] [phone]        Millicent Blazejewski, Canary Brim, MD 12/20/21 2158

## 2021-12-21 LAB — URINE CULTURE: Culture: NO GROWTH

## 2023-01-19 ENCOUNTER — Encounter (HOSPITAL_BASED_OUTPATIENT_CLINIC_OR_DEPARTMENT_OTHER): Payer: Self-pay

## 2023-01-19 ENCOUNTER — Other Ambulatory Visit: Payer: Self-pay

## 2023-01-19 ENCOUNTER — Emergency Department (HOSPITAL_BASED_OUTPATIENT_CLINIC_OR_DEPARTMENT_OTHER)
Admission: EM | Admit: 2023-01-19 | Discharge: 2023-01-19 | Disposition: A | Discharge status: Home / Self Care | Payer: Medicare Other | Attending: Emergency Medicine | Admitting: Emergency Medicine

## 2023-01-19 ENCOUNTER — Emergency Department (HOSPITAL_BASED_OUTPATIENT_CLINIC_OR_DEPARTMENT_OTHER): Payer: Medicare Other

## 2023-01-19 DIAGNOSIS — S0990XA Unspecified injury of head, initial encounter: Secondary | ICD-10-CM

## 2023-01-19 DIAGNOSIS — S0003XA Contusion of scalp, initial encounter: Secondary | ICD-10-CM | POA: Insufficient documentation

## 2023-01-19 DIAGNOSIS — M542 Cervicalgia: Secondary | ICD-10-CM | POA: Diagnosis not present

## 2023-01-19 DIAGNOSIS — W19XXXA Unspecified fall, initial encounter: Secondary | ICD-10-CM

## 2023-01-19 MED ORDER — ONDANSETRON 4 MG PO TBDP
4.0000 mg | ORAL_TABLET | Freq: Once | ORAL | Status: AC
Start: 2023-01-19 — End: 2023-01-19
  Administered 2023-01-19: 4 mg via ORAL
  Filled 2023-01-19: qty 1

## 2023-01-19 MED ORDER — ACETAMINOPHEN 325 MG PO TABS
650.0000 mg | ORAL_TABLET | Freq: Once | ORAL | Status: AC
Start: 2023-01-19 — End: 2023-01-19
  Administered 2023-01-19: 650 mg via ORAL
  Filled 2023-01-19: qty 2

## 2023-01-19 NOTE — ED Triage Notes (Signed)
Patient slid out of his wheelchair and hit his head on a hardwood floors. No LOC or blood thinner use. Urgent care sent him over for a CT scan.

## 2023-01-19 NOTE — ED Provider Notes (Signed)
[place] EMERGENCY DEPARTMENT AT [facility] Provider Note   CSN: [id number] Arrival date & time: 01/19/23  1200     History  Chief Complaint  Patient presents with   Fall   Head Injury    Arthur Abbott is a 37 y.o. male.  Patient here after head trauma.  Some dizziness nausea feeling.  Patient uses wheelchair as he has cerebral palsy.  Is not on blood thinners.  He fell backwards hit his head hard on the hardwood floor.  Part of the wheelchair broke off.  He is having some headache and neck pain.  He has a history of cerebral palsy, Crohn's.  He denies any other pain elsewhere.  No other extremity pain.  He feels like he is moving things okay.  He is got a bump to the back of his head.  He was sent here by urgent care for imaging.  Denies any vision changes, speech changes.  The history is provided by the patient and a caregiver.       Home Medications Prior to Admission medications   Medication Sig Start Date End Date Taking? Authorizing Provider  artificial tears (LACRILUBE) OINT ophthalmic ointment Place into both eyes at bedtime. 05/30/19   Law, Waylan Boga, PA-C  busPIRone (BUSPAR) 15 MG tablet Take 1 tab bid 01/17/17   [provider]  cyclobenzaprine (FLEXERIL) 10 MG tablet Take 1 tablet (10 mg total) by mouth 2 (two) times daily as needed for muscle spasms. 12/20/21   Tegeler, Canary Brim, MD  DULoxetine (CYMBALTA) 60 MG capsule Take 1 cap twice a day 01/17/17   [provider]  hydroxypropyl methylcellulose / hypromellose (ISOPTO TEARS / GONIOVISC) 2.5 % ophthalmic solution Place 1 drop into the right eye 4 (four) times daily. 05/30/19   Law, Waylan Boga, PA-C  lidocaine (LIDODERM) 5 % Place 1 patch onto the skin daily. Remove & Discard patch within 12 hours or as directed by MD 12/20/21   Tegeler, Canary Brim, MD  mercaptopurine (PURINETHOL) 50 MG tablet TAKE ONE BY MOUTH DAILY 03/28/14   [provider]  risperiDONE (RISPERDAL)  0.25 MG tablet Take 1 to 2 tabs in the morning and 1 to 2 tab at night 01/17/17   [provider]      Allergies    Patient has no known allergies.    Review of Systems   Review of Systems  Physical Exam Updated Vital Signs BP (!) 160/97   Pulse (!) 105   Temp 97.6 F (36.4 C) (Oral)   Resp 16   Ht 5\' 3"  (1.6 m)   Wt 95 kg   SpO2 96%   BMI 37.10 kg/m  Physical Exam Vitals and nursing note reviewed.  Constitutional:      General: He is not in acute distress.    Appearance: He is well-developed.  HENT:     Head:     Comments: Hematoma to his occiput    Mouth/Throat:     Mouth: Mucous membranes are moist.  Eyes:     Extraocular Movements: Extraocular movements intact.     Conjunctiva/sclera: Conjunctivae normal.     Pupils: Pupils are equal, round, and reactive to light.  Neck:     Comments: No midline spinal tenderness but tenderness to the paraspinal cervical muscles bilaterally Cardiovascular:     Rate and Rhythm: Normal rate and regular rhythm.     Heart sounds: No murmur heard. Pulmonary:     Effort: Pulmonary effort is  normal. No respiratory distress.     Breath sounds: Normal breath sounds.  Abdominal:     Palpations: Abdomen is soft.     Tenderness: There is no abdominal tenderness.  Musculoskeletal:        General: No swelling or tenderness. Normal range of motion.     Cervical back: Normal range of motion and neck supple.  Skin:    General: Skin is warm and dry.     Capillary Refill: Capillary refill takes less than 2 seconds.  Neurological:     General: No focal deficit present.     Mental Status: He is alert. Mental status is at baseline.     Comments: Strength and sensation appear to be at baseline, speech at baseline  Psychiatric:        Mood and Affect: Mood normal.     ED Results / Procedures / Treatments   Labs (all labs ordered are listed, but only abnormal results are displayed) Labs Reviewed - No data to  display  EKG None  Radiology CT Head Wo Contrast  Result Date: 01/19/2023 CLINICAL DATA:  Fall backwards out of the patient's wheelchair with trauma to the back of the head. Head and neck pain. EXAM: CT HEAD WITHOUT CONTRAST CT CERVICAL SPINE WITHOUT CONTRAST TECHNIQUE: Multidetector CT imaging of the head and cervical spine was performed following the standard protocol without intravenous contrast. Multiplanar CT image reconstructions of the cervical spine were also generated. RADIATION DOSE REDUCTION: This exam was performed according to the departmental dose-optimization program which includes automated exposure control, adjustment of the mA and/or kV according to patient size and/or use of iterative reconstruction technique. COMPARISON:  CT head dated 05/30/2019. FINDINGS: CT HEAD FINDINGS Brain: No evidence of acute infarction, hemorrhage, hydrocephalus, extra-axial collection or mass lesion/mass effect. Vascular: No hyperdense vessel or unexpected calcification. Skull: Normal. Negative for fracture or focal lesion. Sinuses/Orbits: There is left maxillary sinus and left ethmoid sinus disease. Other: None. CT CERVICAL SPINE FINDINGS Alignment: Normal. Skull base and vertebrae: No acute fracture. No primary bone lesion or focal pathologic process. Soft tissues and spinal canal: No prevertebral fluid or swelling. No visible canal hematoma. Disc levels: Up to moderate multilevel degenerative disc and joint disease. Upper chest: Negative. Other: None. IMPRESSION: 1. No acute intracranial process. 2. No acute fracture or subluxation of the cervical spine. Electronically Signed   By: Romona Curls M.D.   On: 01/19/2023 13:14   CT Cervical Spine Wo Contrast  Result Date: 01/19/2023 CLINICAL DATA:  Fall backwards out of the patient's wheelchair with trauma to the back of the head. Head and neck pain. EXAM: CT HEAD WITHOUT CONTRAST CT CERVICAL SPINE WITHOUT CONTRAST TECHNIQUE: Multidetector CT imaging of the  head and cervical spine was performed following the standard protocol without intravenous contrast. Multiplanar CT image reconstructions of the cervical spine were also generated. RADIATION DOSE REDUCTION: This exam was performed according to the departmental dose-optimization program which includes automated exposure control, adjustment of the mA and/or kV according to patient size and/or use of iterative reconstruction technique. COMPARISON:  CT head dated 05/30/2019. FINDINGS: CT HEAD FINDINGS Brain: No evidence of acute infarction, hemorrhage, hydrocephalus, extra-axial collection or mass lesion/mass effect. Vascular: No hyperdense vessel or unexpected calcification. Skull: Normal. Negative for fracture or focal lesion. Sinuses/Orbits: There is left maxillary sinus and left ethmoid sinus disease. Other: None. CT CERVICAL SPINE FINDINGS Alignment: Normal. Skull base and vertebrae: No acute fracture. No primary bone lesion or focal pathologic process. Soft tissues  and spinal canal: No prevertebral fluid or swelling. No visible canal hematoma. Disc levels: Up to moderate multilevel degenerative disc and joint disease. Upper chest: Negative. Other: None. IMPRESSION: 1. No acute intracranial process. 2. No acute fracture or subluxation of the cervical spine. Electronically Signed   By: Romona Curls M.D.   On: 01/19/2023 13:14    Procedures Procedures    Medications Ordered in ED Medications  ondansetron (ZOFRAN-ODT) disintegrating tablet 4 mg (4 mg Oral Given 01/19/23 1224)  acetaminophen (TYLENOL) tablet 650 mg (650 mg Oral Given 01/19/23 1224)    ED Course/ Medical Decision Making/ A&P                             Medical Decision Making Amount and/or Complexity of Data Reviewed Radiology: ordered.  Risk OTC drugs. Prescription drug management.   Lavenia Atlas is here with headache and some neck pain after fall.  Unremarkable vitals.  No fever.  He has history of cerebral palsy on wheelchair.   Larey Seat backwards out of his wheelchair hit his head hard.  Small hematoma to the back of his head.  He says some tenderness to the paraspinal cervical muscles.  With no midline spinal tenderness.  He is sent here by urgent care for imaging.  He is not on any blood thinners.  He is at his neurologic baseline per himself and caregiver.  I will see any other tenderness on exam.  Is not tender anywhere else.  He is got some mild concussion symptoms probably with headache and dizzy feeling and nausea.  Will give Zofran and Tylenol and get a head and neck CT.  Otherwise he is well-appearing.  CT scan per radiology report of head and neck are unremarkable.  Patient is feeling well.  Discussed may be mild concussion with patient and family.  Understand return precautions.  Recommend follow-up with primary care doctor if having persistent symptoms and may need further concussion workup and management.  Recommend Tylenol and ibuprofen for pain.  He is otherwise well-appearing.  I do not have any concern for other acute process.  This chart was dictated using voice recognition software.  Despite best efforts to proofread,  errors can occur which can change the documentation meaning.         Final Clinical Impression(s) / ED Diagnoses Final diagnoses:  Injury of head, initial encounter  Fall, initial encounter    Rx / DC Orders ED Discharge Orders     None         Virgina Norfolk, DO 01/19/23 1325

## 2023-01-19 NOTE — Discharge Instructions (Addendum)
Recommend Tylenol and ibuprofen for any further pain.  If you having persistent headaches, trouble focusing or other concussion symptoms as discussed follow-up with your primary care doctor.  Return if you have any worsening symptoms.

## 2024-01-02 ENCOUNTER — Emergency Department (HOSPITAL_BASED_OUTPATIENT_CLINIC_OR_DEPARTMENT_OTHER)
Admission: EM | Admit: 2024-01-02 | Discharge: 2024-01-02 | Disposition: A | Discharge status: Home / Self Care | Attending: Emergency Medicine | Admitting: Emergency Medicine

## 2024-01-02 ENCOUNTER — Encounter (HOSPITAL_BASED_OUTPATIENT_CLINIC_OR_DEPARTMENT_OTHER): Payer: Self-pay | Admitting: Emergency Medicine

## 2024-01-02 ENCOUNTER — Other Ambulatory Visit: Payer: Self-pay

## 2024-01-02 ENCOUNTER — Emergency Department (HOSPITAL_BASED_OUTPATIENT_CLINIC_OR_DEPARTMENT_OTHER)

## 2024-01-02 DIAGNOSIS — R1084 Generalized abdominal pain: Secondary | ICD-10-CM | POA: Diagnosis present

## 2024-01-02 DIAGNOSIS — R197 Diarrhea, unspecified: Secondary | ICD-10-CM | POA: Diagnosis not present

## 2024-01-02 DIAGNOSIS — R000 Tachycardia, unspecified: Secondary | ICD-10-CM | POA: Diagnosis not present

## 2024-01-02 DIAGNOSIS — R112 Nausea with vomiting, unspecified: Secondary | ICD-10-CM | POA: Insufficient documentation

## 2024-01-02 DIAGNOSIS — R509 Fever, unspecified: Secondary | ICD-10-CM | POA: Insufficient documentation

## 2024-01-02 LAB — COMPREHENSIVE METABOLIC PANEL WITH GFR
ALT: 21 U/L (ref 0–44)
AST: 20 U/L (ref 15–41)
Albumin: 4.3 g/dL (ref 3.5–5.0)
Alkaline Phosphatase: 38 U/L (ref 38–126)
Anion gap: 13 (ref 5–15)
BUN: 13 mg/dL (ref 6–20)
CO2: 22 mmol/L (ref 22–32)
Calcium: 8.6 mg/dL — ABNORMAL LOW (ref 8.9–10.3)
Chloride: 99 mmol/L (ref 98–111)
Creatinine, Ser: 0.78 mg/dL (ref 0.61–1.24)
GFR, Estimated: >60 mL/min (ref >60)
Glucose, Bld: 105 mg/dL — ABNORMAL HIGH (ref 70–99)
Potassium: 3.5 mmol/L (ref 3.5–5.1)
Sodium: 135 mmol/L (ref 135–145)
Total Bilirubin: 0.5 mg/dL (ref 0.0–1.2)
Total Protein: 7.8 g/dL (ref 6.5–8.1)

## 2024-01-02 LAB — CBC WITH DIFFERENTIAL/PLATELET
Abs Immature Granulocytes: 0.04 10*3/uL (ref 0.00–0.07)
Basophils Absolute: 0 10*3/uL (ref 0.0–0.1)
Basophils Relative: 0 %
Eosinophils Absolute: 0.1 10*3/uL (ref 0.0–0.5)
Eosinophils Relative: 1 %
HCT: 41.1 % (ref 39.0–52.0)
Hemoglobin: 13.5 g/dL (ref 13.0–17.0)
Immature Granulocytes: 1 %
Lymphocytes Relative: 9 %
Lymphs Abs: 0.7 10*3/uL (ref 0.7–4.0)
MCH: 26.9 pg (ref 26.0–34.0)
MCHC: 32.8 g/dL (ref 30.0–36.0)
MCV: 81.9 fL (ref 80.0–100.0)
Monocytes Absolute: 0.7 10*3/uL (ref 0.1–1.0)
Monocytes Relative: 9 %
Neutro Abs: 6.2 10*3/uL (ref 1.7–7.7)
Neutrophils Relative %: 80 %
Platelets: 304 10*3/uL (ref 150–400)
RBC: 5.02 MIL/uL (ref 4.22–5.81)
RDW: 14.1 % (ref 11.5–15.5)
WBC: 7.8 10*3/uL (ref 4.0–10.5)
nRBC: 0 % (ref 0.0–0.2)

## 2024-01-02 LAB — RESP PANEL BY RT-PCR (RSV, FLU A&B, COVID)  RVPGX2
Influenza A by PCR: NEGATIVE
Influenza B by PCR: NEGATIVE
Resp Syncytial Virus by PCR: NEGATIVE
SARS Coronavirus 2 by RT PCR: NEGATIVE

## 2024-01-02 LAB — LIPASE, BLOOD: Lipase: 23 U/L (ref 11–51)

## 2024-01-02 MED ORDER — ONDANSETRON HCL 4 MG/2ML IJ SOLN
4.0000 mg | Freq: Once | INTRAMUSCULAR | Status: AC
  Administered 2024-01-02: 4 mg via INTRAVENOUS
  Filled 2024-01-02: qty 2

## 2024-01-02 MED ORDER — AMOXICILLIN-POT CLAVULANATE 875-125 MG PO TABS: 1.0000 | ORAL_TABLET | Freq: Two times a day (BID) | ORAL | 0 refills | Status: DC

## 2024-01-02 MED ORDER — ONDANSETRON 4 MG PO TBDP
4.0000 mg | ORAL_TABLET | Freq: Three times a day (TID) | ORAL | 0 refills | Status: AC | PRN
Start: 2024-01-02 — End: ?

## 2024-01-02 MED ORDER — IOHEXOL 300 MG/ML  SOLN
100.0000 mL | Freq: Once | INTRAMUSCULAR | Status: AC | PRN
  Administered 2024-01-02: 100 mL via INTRAVENOUS

## 2024-01-02 MED ORDER — FENTANYL CITRATE PF 50 MCG/ML IJ SOSY
50.0000 ug | PREFILLED_SYRINGE | Freq: Once | INTRAMUSCULAR | Status: AC
  Administered 2024-01-02: 50 ug via INTRAVENOUS
  Filled 2024-01-02: qty 1

## 2024-01-02 MED ORDER — LACTATED RINGERS IV BOLUS
1000.0000 mL | Freq: Once | INTRAVENOUS | Status: AC
  Administered 2024-01-02: 1000 mL via INTRAVENOUS

## 2024-01-02 NOTE — Discharge Instructions (Signed)
 STOP the Doxycycline. Your CT scan shows a possible (but not definite) abnormality of your appendix.  This could be a early appendicitis or not your appendix at all.  Due to this uncertainty, we are putting you on antibiotics (Augmentin) and we are also giving you nausea medicine (Zofran ).  If you develop new or worsening abdominal pain at any time, if your pain does not improve in the next 24 hours, if you develop vomiting, fever, or any other new/concerning symptoms then return to the ER.

## 2024-01-02 NOTE — ED Triage Notes (Addendum)
 Pt started on doxycycline on 6/24.  Pt started to have abdominal pain with N/V/D since yesterday.  Pt unable to eat well, decreased oral fluids due to nausea/vomiting.  Mother is here with patient and concerned with dehydration.  Pt stopped taking it yesterday.  Mothers states he was pale, and felt hot.  Unknown temp.  Last emesis was 2 days ago.  Last diarrhea was 2 hours ago.  Continues with nausea.

## 2024-01-02 NOTE — ED Provider Notes (Signed)
 [place] [facility] Provider Note   CSN: [id number] Arrival date & time: 01/02/24  9243     Patient presents with: Medication Reaction   Arthur Abbott is a 38 y.o. male.   HPI 38 year old male with a history of Crohn's and cerebral palsy presents with abdominal pain, vomiting, and diarrhea.  History is from patient and parents.  Patient has been on doxycycline for the last week or so for an epidermal cyst that is going to be surgically removed.  Recently started draining.  However over the last 2 days he has been developing nausea and dry heaving as well as diarrhea.  No blood in the diarrhea, but it is yellow/green.  He has been getting dehydrated and yesterday while on the toilet he was pale and lightheaded.  He has been much weaker than normal.  They think he had a fever last night as he was very sweaty.  He is also having abdominal pain, primarily in the right lower quadrant.  Has had Crohn's exacerbations before but this is more severe than those. He did take a dose of mercaptopurine to try to help if it was a Crohn's problem.  Prior to Admission medications   Medication Sig Start Date End Date Taking? Authorizing Provider  amoxicillin-clavulanate (AUGMENTIN) 875-125 MG tablet Take 1 tablet by mouth every 12 (twelve) hours. 01/02/24  Yes Freddi Hamilton, MD  mercaptopurine (PURINETHOL) 50 MG tablet Take 100 mg by mouth. 12/12/21  Yes [provider]  ondansetron  (ZOFRAN -ODT) 4 MG disintegrating tablet Take 1 tablet (4 mg total) by mouth every 8 (eight) hours as needed for nausea or vomiting. 01/02/24  Yes Freddi Hamilton, MD  artificial tears (LACRILUBE) OINT ophthalmic ointment Place into both eyes at bedtime. 05/30/19   Law, Alexandra M, PA-C  busPIRone (BUSPAR) 15 MG tablet Take 1 tab bid 01/17/17   [provider]  cyclobenzaprine  (FLEXERIL ) 10 MG tablet Take 1 tablet (10 mg total) by mouth 2 (two) times daily as needed for muscle  spasms. 12/20/21   Tegeler, Lonni PARAS, MD  DULoxetine (CYMBALTA) 60 MG capsule Take 1 cap twice a day 01/17/17   [provider]  hydroxypropyl methylcellulose / hypromellose (ISOPTO TEARS / GONIOVISC) 2.5 % ophthalmic solution Place 1 drop into the right eye 4 (four) times daily. 05/30/19   Law, Lorane HERO, PA-C  lidocaine  (LIDODERM ) 5 % Place 1 patch onto the skin daily. Remove & Discard patch within 12 hours or as directed by MD 12/20/21   Tegeler, Lonni PARAS, MD  risperiDONE (RISPERDAL) 0.25 MG tablet Take 1 to 2 tabs in the morning and 1 to 2 tab at night 01/17/17   [provider]    Allergies: Patient has no known allergies.    Review of Systems  Constitutional:  Positive for fever (subjective).  Gastrointestinal:  Positive for abdominal pain, diarrhea, nausea and vomiting.  Neurological:  Positive for light-headedness.    Updated Vital Signs BP (!) 142/93 (BP Location: Right Arm)   Pulse 92   Temp 98.6 F (37 C) (Oral)   Resp 20   Ht 5' 2 (1.575 m)   Wt 90.7 kg   SpO2 96%   BMI 36.58 kg/m   Physical Exam Vitals and nursing note reviewed.  Constitutional:      Appearance: He is well-developed.  HENT:     Head: Normocephalic and atraumatic.  Cardiovascular:     Rate and Rhythm: Regular rhythm. Tachycardia present.  Heart sounds: Normal heart sounds.     Comments: HR~100 Pulmonary:     Effort: Pulmonary effort is normal.     Breath sounds: Normal breath sounds.  Abdominal:     Palpations: Abdomen is soft.     Tenderness: There is abdominal tenderness (generalized).  Skin:    General: Skin is warm and dry.  Neurological:     Mental Status: He is alert.     (all labs ordered are listed, but only abnormal results are displayed) Labs Reviewed  COMPREHENSIVE METABOLIC PANEL WITH GFR - Abnormal; Notable for the following components:      Result Value   Glucose, Bld 105 (*)    Calcium 8.6 (*)    All other components within normal limits   RESP PANEL BY RT-PCR (RSV, FLU A&B, COVID)  RVPGX2  C DIFFICILE QUICK SCREEN W PCR REFLEX    GASTROINTESTINAL PANEL BY PCR, STOOL (REPLACES STOOL CULTURE)  CBC WITH DIFFERENTIAL/PLATELET  LIPASE, BLOOD    EKG: EKG Interpretation Date/Time:  Thursday January 02 2024 09:30:14 EDT Ventricular Rate:  91 PR Interval:  130 QRS Duration:  86 QT Interval:  333 QTC Calculation: 410 R Axis:   45  Text Interpretation: Sinus rhythm Borderline T abnormalities, inferior leads no significant change since 2023 Confirmed by Freddi Hamilton [id number]) on 01/02/2024 9:42:49 AM  Radiology: CT ABDOMEN PELVIS W CONTRAST Result Date: 01/02/2024 CLINICAL DATA:  Right lower quadrant abdominal pain, nausea/vomiting/diarrhea since yesterday EXAM: CT ABDOMEN AND PELVIS WITH CONTRAST TECHNIQUE: Multidetector CT imaging of the abdomen and pelvis was performed using the standard protocol following bolus administration of intravenous contrast. RADIATION DOSE REDUCTION: This exam was performed according to the departmental dose-optimization program which includes automated exposure control, adjustment of the mA and/or kV according to patient size and/or use of iterative reconstruction technique. CONTRAST:  OMNIPAQUE IOHEXOL 300 MG/ML  SOLN COMPARISON:  01/27/2020 FINDINGS: Lower chest: No acute pleural or parenchymal lung disease. Hepatobiliary: Mild hepatic steatosis. No focal liver abnormality is seen. No gallstones, gallbladder wall thickening, or biliary dilatation. Pancreas: Unremarkable. No pancreatic ductal dilatation or surrounding inflammatory changes. Spleen: Indeterminate 3 cm hypodensity within the posteromedial spleen, reference image 15/2. No evidence of splenomegaly. Adrenals/Urinary Tract: Adrenal glands are unremarkable. Kidneys are normal, without renal calculi, focal lesion, or hydronephrosis. Bladder is unremarkable. Stomach/Bowel: No bowel obstruction or ileus. There is mild dilation of the distal  appendiceal tip measuring 9 mm, but no evidence of appendiceal wall thickening or periappendiceal fat stranding. There is a punctate focus of gas within the lumen of the appendiceal tip suggesting patency. There is mild wall thickening of the splenic flexure of the colon, nonspecific given decompressed state. Vascular/Lymphatic: No significant vascular findings are present. Multiple subcentimeter mesenteric lymph nodes are seen within the upper abdomen, nonspecific. No enlarged abdominal or pelvic lymph nodes. Reproductive: Prostate is unremarkable. Other: No free fluid or free intraperitoneal gas. No abdominal wall hernia. Musculoskeletal: Postsurgical changes within the left pelvis and left hip. No acute bony abnormalities. Reconstructed images demonstrate no additional findings. IMPRESSION: 1. Mild colonic wall thickening at the splenic flexure, nonspecific given decompressed state. Underlying colitis cannot be excluded. 2. Borderline dilatation of the appendiceal tip, with no other imaging findings to suggest acute appendicitis. 3. Multiple nonspecific subcentimeter lymph nodes throughout the upper abdominal mesentery, likely reactive. No pathologic adenopathy. 4. Hepatic steatosis. 5. Indeterminate 3 cm splenic hypodensity, favor hemangioma. Electronically Signed   By: Ozell Daring M.D.   On: 01/02/2024 11:06  Procedures   Medications Ordered in the ED  lactated ringers bolus 1,000 mL (0 mLs Intravenous Stopped 01/02/24 1221)  fentaNYL (SUBLIMAZE) injection 50 mcg (50 mcg Intravenous Given 01/02/24 1000)  ondansetron  (ZOFRAN ) injection 4 mg (4 mg Intravenous Given 01/02/24 1000)  iohexol (OMNIPAQUE) 300 MG/ML solution 100 mL (100 mLs Intravenous Contrast Given 01/02/24 1046)                                    Medical Decision Making Amount and/or Complexity of Data Reviewed Labs: ordered.    Details: Normal WBC, unremarkable electrolytes Radiology: ordered and independent interpretation  performed.    Details: No bowel obstruction. ECG/medicine tests: ordered and independent interpretation performed.    Details: No arrhythmia  Risk Prescription drug management.   Patient presents with vomiting, diarrhea, abdominal pain.  Initially seems to be an acute process but later patient tells me he gets on and off abdominal pain similar to this.  CT is questionable for possible appendicitis but seems less likely with no inflammatory changes.  I did discuss his case with Dr. Debby of general surgery, who has reviewed the CT images.  Feels this is unlikely to be appendicitis.  However if there is any concern, could put on Augmentin but no indication from her perspective of needing surgery or observation.  I discussed with dad and patient.  He prefers to go home rather than be observed in the hospital which I think is reasonable.  Most likely this is not appendicitis.  They would still like to try the Augmentin which I think is reasonable and I discussed that he should talk to their dermatologist about whether or not to continue the doxycycline.  Will give Zofran  for the nausea and recommend over-the-counter meds such as Imodium for diarrhea.  No obvious evidence of Crohn's colitis, I do not think treatment from that perspective is needed.  He feels well enough for discharge.  Will give return precautions.     Final diagnoses:  Generalized abdominal pain    ED Discharge Orders          Ordered    ondansetron  (ZOFRAN -ODT) 4 MG disintegrating tablet  Every 8 hours PRN        01/02/24 1347    amoxicillin-clavulanate (AUGMENTIN) 875-125 MG tablet  Every 12 hours        01/02/24 1347               Freddi Hamilton, MD 01/02/24 1438

## 2024-03-15 ENCOUNTER — Encounter (HOSPITAL_BASED_OUTPATIENT_CLINIC_OR_DEPARTMENT_OTHER): Payer: Self-pay | Admitting: Emergency Medicine

## 2024-03-15 ENCOUNTER — Emergency Department (HOSPITAL_BASED_OUTPATIENT_CLINIC_OR_DEPARTMENT_OTHER)
Admission: EM | Admit: 2024-03-15 | Discharge: 2024-03-15 | Discharge status: Against Medical Advice | Attending: Emergency Medicine | Admitting: Emergency Medicine

## 2024-03-15 DIAGNOSIS — R1032 Left lower quadrant pain: Secondary | ICD-10-CM | POA: Insufficient documentation

## 2024-03-15 DIAGNOSIS — R519 Headache, unspecified: Secondary | ICD-10-CM | POA: Diagnosis not present

## 2024-03-15 DIAGNOSIS — Z5321 Procedure and treatment not carried out due to patient leaving prior to being seen by health care provider: Secondary | ICD-10-CM | POA: Diagnosis not present

## 2024-03-15 NOTE — ED Triage Notes (Signed)
 Pt c/o LLQ pain x 1 month; also c/o ongoing HA; hx of Crohn's and is overdue for a colonoscopy d/t scheduling issues at GI office

## 2024-03-16 NOTE — Telephone Encounter (Signed)
 Mother calling missed colon d/t family emergency   it was thought he had appendicitis   ,episodic pain , muscle ache , joint pain  imaging last done  01/02/24, colonic wall thickening  and boarder line appendical  tip dilatation. Having severe pain  for days , worse over 1-2 weeks  hospital procedure on order. Advised back to ED. Arthur Abbott

## 2024-03-16 NOTE — Progress Notes (Signed)
 [facility] Transitional Care Management     ED VISIT ONLY   Situation   Arthur Abbott is a 38 y.o. male who was contacted today for a transitional care outreach.  Admission Date: 03/15/24  Discharge Date:03/15/24   Institution: [facility]   Discharge Disposition: 07-Left Against Medical Advice/Left Without Being Seen/Elopement  Diagnosis:  none listed  Is this visit eligible for TCM? No  Background   Since Discharge:   Primary Care Provider on Record: Aliene Cary, MD   Assessment    General Assessment     None           Recommendation    PCP/specialist notified:   Referral Made:      Future Appointments  Date Time Provider Department Center  06/18/2024 11:00 AM [place] Jenel Maurice Mickey., MD [facility] E[facility] [id number]        Electronically signed by: Suzen GORMAN Sharps 03/16/2024 12:14 PM

## 2024-03-17 ENCOUNTER — Emergency Department (HOSPITAL_BASED_OUTPATIENT_CLINIC_OR_DEPARTMENT_OTHER)
Admission: EM | Admit: 2024-03-17 | Discharge: 2024-03-17 | Disposition: A | Discharge status: Home / Self Care | Attending: Emergency Medicine | Admitting: Emergency Medicine

## 2024-03-17 ENCOUNTER — Other Ambulatory Visit: Payer: Self-pay

## 2024-03-17 ENCOUNTER — Encounter (HOSPITAL_BASED_OUTPATIENT_CLINIC_OR_DEPARTMENT_OTHER): Payer: Self-pay

## 2024-03-17 ENCOUNTER — Emergency Department (HOSPITAL_BASED_OUTPATIENT_CLINIC_OR_DEPARTMENT_OTHER)

## 2024-03-17 DIAGNOSIS — R1031 Right lower quadrant pain: Secondary | ICD-10-CM | POA: Diagnosis present

## 2024-03-17 DIAGNOSIS — K529 Noninfective gastroenteritis and colitis, unspecified: Secondary | ICD-10-CM | POA: Diagnosis not present

## 2024-03-17 LAB — CBC
HCT: 41.6 % (ref 39.0–52.0)
Hemoglobin: 13.6 g/dL (ref 13.0–17.0)
MCH: 26.2 pg (ref 26.0–34.0)
MCHC: 32.7 g/dL (ref 30.0–36.0)
MCV: 80.2 fL (ref 80.0–100.0)
Platelets: 299 K/uL (ref 150–400)
RBC: 5.19 MIL/uL (ref 4.22–5.81)
RDW: 13.3 % (ref 11.5–15.5)
WBC: 7.2 K/uL (ref 4.0–10.5)
nRBC: 0 % (ref 0.0–0.2)

## 2024-03-17 LAB — URINALYSIS, ROUTINE W REFLEX MICROSCOPIC
Bilirubin Urine: NEGATIVE
Glucose, UA: NEGATIVE mg/dL
Hgb urine dipstick: NEGATIVE
Ketones, ur: NEGATIVE mg/dL
Leukocytes,Ua: NEGATIVE
Nitrite: NEGATIVE
Protein, ur: 30 mg/dL — AB
Specific Gravity, Urine: >=1.03 (ref 1.005–1.030)
pH: 5.5 (ref 5.0–8.0)

## 2024-03-17 LAB — COMPREHENSIVE METABOLIC PANEL WITH GFR
ALT: 29 U/L (ref 0–44)
AST: 20 U/L (ref 15–41)
Albumin: 4.4 g/dL (ref 3.5–5.0)
Alkaline Phosphatase: 37 U/L — ABNORMAL LOW (ref 38–126)
Anion gap: 12 (ref 5–15)
BUN: 10 mg/dL (ref 6–20)
CO2: 23 mmol/L (ref 22–32)
Calcium: 9.1 mg/dL (ref 8.9–10.3)
Chloride: 103 mmol/L (ref 98–111)
Creatinine, Ser: 0.72 mg/dL (ref 0.61–1.24)
GFR, Estimated: >60 mL/min (ref >60)
Glucose, Bld: 96 mg/dL (ref 70–99)
Potassium: 4 mmol/L (ref 3.5–5.1)
Sodium: 138 mmol/L (ref 135–145)
Total Bilirubin: 0.3 mg/dL (ref 0.0–1.2)
Total Protein: 7.8 g/dL (ref 6.5–8.1)

## 2024-03-17 LAB — URINALYSIS, MICROSCOPIC (REFLEX)

## 2024-03-17 LAB — LIPASE, BLOOD: Lipase: 33 U/L (ref 11–51)

## 2024-03-17 MED ORDER — IOHEXOL 300 MG/ML  SOLN
100.0000 mL | Freq: Once | INTRAMUSCULAR | Status: AC | PRN
  Administered 2024-03-17: 100 mL via INTRAVENOUS

## 2024-03-17 MED ORDER — ONDANSETRON HCL 4 MG/2ML IJ SOLN
4.0000 mg | Freq: Once | INTRAMUSCULAR | Status: AC
  Administered 2024-03-17: 4 mg via INTRAVENOUS
  Filled 2024-03-17: qty 2

## 2024-03-17 MED ORDER — MORPHINE SULFATE (PF) 4 MG/ML IV SOLN
4.0000 mg | Freq: Once | INTRAVENOUS | Status: AC
  Administered 2024-03-17: 4 mg via INTRAVENOUS
  Filled 2024-03-17: qty 1

## 2024-03-17 MED ORDER — SODIUM CHLORIDE 0.9 % IV BOLUS
1000.0000 mL | Freq: Once | INTRAVENOUS | Status: AC
Start: 2024-03-17 — End: 2024-03-17
  Administered 2024-03-17: 1000 mL via INTRAVENOUS

## 2024-03-17 MED ORDER — AMOXICILLIN-POT CLAVULANATE 875-125 MG PO TABS: 1.0000 | ORAL_TABLET | Freq: Two times a day (BID) | ORAL | 0 refills | Status: AC

## 2024-03-17 NOTE — ED Triage Notes (Signed)
 RLQ abd pain for 4 days. Reports blood in stool  Denies NVD.

## 2024-03-17 NOTE — ED Provider Notes (Signed)
 [place] [facility] Provider Note   CSN: [id number] Arrival date & time: 03/17/24  9256     Patient presents with: Abdominal Pain   Arthur Abbott is a 38 y.o. male.   38 yo M with a chief complaints of right lower quadrant abdominal pain bloody diarrhea going on for about 4 days now.  Patient has a history of Crohn's.  He is worried because he has not been able to get his routine scheduled colonoscopy.  Has been calling the clinic to try and get it rescheduled but was told it could not happen until January.  He has been having joint pains for couple months which are new for him.  Was seen in the ED in July and had some inflammation to the tip of the appendix which the family and patient are still worried about.  No fevers no vomiting.   Abdominal Pain      Prior to Admission medications   Medication Sig Start Date End Date Taking? Authorizing Provider  amoxicillin -clavulanate (AUGMENTIN ) 875-125 MG tablet Take 1 tablet by mouth every 12 (twelve) hours. 03/17/24  Yes Emil Share, DO  artificial tears (LACRILUBE) OINT ophthalmic ointment Place into both eyes at bedtime. 05/30/19   Law, Alexandra M, PA-C  busPIRone (BUSPAR) 15 MG tablet Take 1 tab bid 01/17/17   [provider]  cyclobenzaprine  (FLEXERIL ) 10 MG tablet Take 1 tablet (10 mg total) by mouth 2 (two) times daily as needed for muscle spasms. 12/20/21   Tegeler, Lonni PARAS, MD  DULoxetine (CYMBALTA) 60 MG capsule Take 1 cap twice a day 01/17/17   [provider]  hydroxypropyl methylcellulose / hypromellose (ISOPTO TEARS / GONIOVISC) 2.5 % ophthalmic solution Place 1 drop into the right eye 4 (four) times daily. 05/30/19   Law, Alexandra M, PA-C  lidocaine  (LIDODERM ) 5 % Place 1 patch onto the skin daily. Remove & Discard patch within 12 hours or as directed by MD 12/20/21   Tegeler, Lonni PARAS, MD  mercaptopurine (PURINETHOL) 50 MG tablet Take 100 mg by mouth.  12/12/21   [provider]  ondansetron  (ZOFRAN -ODT) 4 MG disintegrating tablet Take 1 tablet (4 mg total) by mouth every 8 (eight) hours as needed for nausea or vomiting. 01/02/24   Freddi Hamilton, MD  risperiDONE (RISPERDAL) 0.25 MG tablet Take 1 to 2 tabs in the morning and 1 to 2 tab at night 01/17/17   [provider]    Allergies: Patient has no known allergies.    Review of Systems  Gastrointestinal:  Positive for abdominal pain.    Updated Vital Signs BP (!) 137/98   Pulse 79   Temp 98.2 F (36.8 C) (Oral)   Resp 15   SpO2 95%   Physical Exam Vitals and nursing note reviewed.  Constitutional:      Appearance: He is well-developed.     Comments: Spastic cerebral palsy  HENT:     Head: Normocephalic and atraumatic.  Eyes:     Pupils: Pupils are equal, round, and reactive to light.  Neck:     Vascular: No JVD.  Cardiovascular:     Rate and Rhythm: Normal rate and regular rhythm.     Heart sounds: No murmur heard.    No friction rub. No gallop.  Pulmonary:     Effort: No respiratory distress.     Breath sounds: No wheezing.  Abdominal:     General: There is no distension.     Tenderness:  There is no abdominal tenderness. There is no guarding or rebound.     Comments: Benign abdominal exam  Musculoskeletal:        General: Normal range of motion.     Cervical back: Normal range of motion and neck supple.  Skin:    Coloration: Skin is not pale.     Findings: No rash.  Neurological:     Mental Status: He is alert and oriented to person, place, and time.  Psychiatric:        Behavior: Behavior normal.     (all labs ordered are listed, but only abnormal results are displayed) Labs Reviewed  COMPREHENSIVE METABOLIC PANEL WITH GFR - Abnormal; Notable for the following components:      Result Value   Alkaline Phosphatase 37 (*)    All other components within normal limits  URINALYSIS, ROUTINE W REFLEX MICROSCOPIC - Abnormal; Notable for the  following components:   Protein, ur 30 (*)    All other components within normal limits  URINALYSIS, MICROSCOPIC (REFLEX) - Abnormal; Notable for the following components:   Bacteria, UA RARE (*)    All other components within normal limits  LIPASE, BLOOD  CBC    EKG: None  Radiology: CT ABDOMEN PELVIS W CONTRAST Result Date: 03/17/2024 CLINICAL DATA:  Right lower quadrant abdominal pain for 4 days. EXAM: CT ABDOMEN AND PELVIS WITH CONTRAST TECHNIQUE: Multidetector CT imaging of the abdomen and pelvis was performed using the standard protocol following bolus administration of intravenous contrast. RADIATION DOSE REDUCTION: This exam was performed according to the departmental dose-optimization program which includes automated exposure control, adjustment of the mA and/or kV according to patient size and/or use of iterative reconstruction technique. CONTRAST:  OMNIPAQUE  IOHEXOL  300 MG/ML  SOLN COMPARISON:  CT abdomen/pelvis dated 01/02/2024. FINDINGS: Lower chest: No acute abnormality. Hepatobiliary: Mild hepatic steatosis. No suspicious focal hepatic lesion. Gallbladder is unremarkable. No biliary dilatation. Pancreas: Unremarkable. No pancreatic ductal dilatation or surrounding inflammatory changes. Spleen: Unchanged indeterminate 2.6 cm hypodensity in the posterior spleen, may represent a hemangioma. Adrenals/Urinary Tract: Adrenal glands are unremarkable. Kidneys are normal, without renal calculi, suspicious focal lesion, or hydronephrosis. Bladder is unremarkable. Stomach/Bowel: The stomach is within normal limits. Small bowel is grossly unremarkable. Appendix appears normal. No obstruction. The left colon is decompressed with slightly more conspicuous wall thickening extending from the splenic flexure through the distal descending colon. Mild pericolonic haziness at the level of the splenic flexure. These findings are suspicious for colitis. Vascular/Lymphatic: No significant vascular  findings are present. No enlarged abdominal or pelvic lymph nodes. Reproductive: Prostate is unremarkable. Other: No abdominopelvic ascites. No intraperitoneal free air. No abdominal wall hernia. Musculoskeletal: No acute osseous abnormality. Postsurgical changes of the left iliac bone and left proximal femur with fixation hardware in place. IMPRESSION: 1. The left colon is decompressed with slightly more conspicuous wall thickening extending from the splenic flexure through the distal descending colon. Mild pericolonic haziness at the level of the splenic flexure. These findings are suspicious for colitis. 2. Normal appendix. 3. Mild hepatic steatosis. 4. Unchanged indeterminate 2.6 cm hypodensity in the posterior spleen, may represent a hemangioma. Electronically Signed   By: Harrietta Sherry M.D.   On: 03/17/2024 10:44     Procedures   Medications Ordered in the ED  morphine  (PF) 4 MG/ML injection 4 mg (4 mg Intravenous Given 03/17/24 0846)  ondansetron  (ZOFRAN ) injection 4 mg (4 mg Intravenous Given 03/17/24 0846)  sodium chloride  0.9 % bolus 1,000 mL (0  mLs Intravenous Stopped 03/17/24 1143)  iohexol  (OMNIPAQUE ) 300 MG/ML solution 100 mL (100 mLs Intravenous Contrast Given 03/17/24 1005)                                    Medical Decision Making Amount and/or Complexity of Data Reviewed Labs: ordered. Radiology: ordered.  Risk Prescription drug management.   38 yo M with a chief complaint of right lower quadrant abdominal pain and bloody stool.  By history this sounds like a Crohn's flare.  Patient is worried he may have appendicitis because he was here a couple months ago and had CT imaging that showed the tip of the appendix inflammation.  There also worried that he has not had his routine colonoscopy performed.  Long discussion with patient and family at bedside.  Will obtain blood work treat symptoms CT imaging reassess.  No leukocytosis no anemia, no significant electrolyte  abnormalities.  LFTs and lipase are unremarkable.  CT imaging with perhaps left upper quadrant colitis.  I discussed results with patient and family.  I think the history and physical are more consistent with a Crohn's flare.  I discussed treating this with steroids.  Family felt that antibiotics worked last time he came in for similar symptoms.  Will trial antibiotics therapy.  GI follow-up.  12:04 PM:  I have discussed the diagnosis/risks/treatment options with the patient and family.  Evaluation and diagnostic testing in the emergency department does not suggest an emergent condition requiring admission or immediate intervention beyond what has been performed at this time.  They will follow up with GI. We also discussed returning to the ED immediately if new or worsening sx occur. We discussed the sx which are most concerning (e.g., sudden worsening pain, fever, inability to tolerate by mouth) that necessitate immediate return. Medications administered to the patient during their visit and any new prescriptions provided to the patient are listed below.  Medications given during this visit Medications  morphine  (PF) 4 MG/ML injection 4 mg (4 mg Intravenous Given 03/17/24 0846)  ondansetron  (ZOFRAN ) injection 4 mg (4 mg Intravenous Given 03/17/24 0846)  sodium chloride  0.9 % bolus 1,000 mL (0 mLs Intravenous Stopped 03/17/24 1143)  iohexol  (OMNIPAQUE ) 300 MG/ML solution 100 mL (100 mLs Intravenous Contrast Given 03/17/24 1005)     The patient appears reasonably screen and/or stabilized for discharge and I doubt any other medical condition or other [facility] requiring further screening, evaluation, or treatment in the ED at this time prior to discharge.       Final diagnoses:  Colitis    ED Discharge Orders          Ordered    amoxicillin -clavulanate (AUGMENTIN ) 875-125 MG tablet  Every 12 hours        03/17/24 1116               Emil Share, DO 03/17/24 1204

## 2024-03-17 NOTE — Discharge Instructions (Signed)
 Please follow-up with your gastroenterologist in the office.  I have given you information in case you want to contact GI in our system.  Please return for worsening pain fever inability eat or drink. Typically for aches and pains we do recommend you take the medications that are over-the-counter.  They tend to be safer than a lot of the prescription medicines.  Max dosing of his medications as listed below. Take 4 over the counter ibuprofen tablets 3 times a day or 2 over-the-counter naproxen tablets twice a day for pain. Also take tylenol  1000mg (2 extra strength) four times a day.
